# Patient Record
Sex: Male | Born: 1975 | Race: Black or African American | Hispanic: No | Marital: Single | Smoking: Never smoker
Health system: Southern US, Community
[De-identification: ages and names within clinical notes are randomized; demographics above are authoritative.]

---

## 2016-11-08 ENCOUNTER — Emergency Department (HOSPITAL_BASED_OUTPATIENT_CLINIC_OR_DEPARTMENT_OTHER)
Admission: EM | Admit: 2016-11-08 | Discharge: 2016-11-08 | Disposition: A | Discharge status: Home / Self Care | Payer: BLUE CROSS/BLUE SHIELD | Attending: Emergency Medicine | Admitting: Emergency Medicine

## 2016-11-08 ENCOUNTER — Encounter (HOSPITAL_BASED_OUTPATIENT_CLINIC_OR_DEPARTMENT_OTHER): Payer: Self-pay

## 2016-11-08 ENCOUNTER — Emergency Department (HOSPITAL_BASED_OUTPATIENT_CLINIC_OR_DEPARTMENT_OTHER): Payer: BLUE CROSS/BLUE SHIELD

## 2016-11-08 DIAGNOSIS — R112 Nausea with vomiting, unspecified: Secondary | ICD-10-CM | POA: Diagnosis not present

## 2016-11-08 DIAGNOSIS — R1084 Generalized abdominal pain: Secondary | ICD-10-CM | POA: Diagnosis not present

## 2016-11-08 DIAGNOSIS — R1013 Epigastric pain: Secondary | ICD-10-CM | POA: Insufficient documentation

## 2016-11-08 LAB — CBC
HCT: 39.5 % (ref 39.0–52.0)
HEMOGLOBIN: 13.9 g/dL (ref 13.0–17.0)
MCH: 28.6 pg (ref 26.0–34.0)
MCHC: 35.2 g/dL (ref 30.0–36.0)
MCV: 81.3 fL (ref 78.0–100.0)
PLATELETS: 133 10*3/uL — AB (ref 150–400)
RBC: 4.86 MIL/uL (ref 4.22–5.81)
RDW: 12.7 % (ref 11.5–15.5)
WBC: 8.1 10*3/uL (ref 4.0–10.5)

## 2016-11-08 LAB — URINALYSIS, ROUTINE W REFLEX MICROSCOPIC
Bilirubin Urine: NEGATIVE
Glucose, UA: NEGATIVE mg/dL
Hgb urine dipstick: NEGATIVE
Ketones, ur: 15 mg/dL — AB
LEUKOCYTES UA: NEGATIVE
Nitrite: NEGATIVE
PROTEIN: 100 mg/dL — AB
Specific Gravity, Urine: 1.024 (ref 1.005–1.030)
pH: 8.5 — ABNORMAL HIGH (ref 5.0–8.0)

## 2016-11-08 LAB — URINALYSIS, MICROSCOPIC (REFLEX)

## 2016-11-08 LAB — COMPREHENSIVE METABOLIC PANEL
ALT: 42 U/L (ref 17–63)
ANION GAP: 12 (ref 5–15)
AST: 43 U/L — ABNORMAL HIGH (ref 15–41)
Albumin: 4.5 g/dL (ref 3.5–5.0)
Alkaline Phosphatase: 52 U/L (ref 38–126)
BUN: 15 mg/dL (ref 6–20)
CALCIUM: 9.1 mg/dL (ref 8.9–10.3)
CHLORIDE: 103 mmol/L (ref 101–111)
CO2: 22 mmol/L (ref 22–32)
CREATININE: 0.9 mg/dL (ref 0.61–1.24)
Glucose, Bld: 116 mg/dL — ABNORMAL HIGH (ref 65–99)
Potassium: 3.3 mmol/L — ABNORMAL LOW (ref 3.5–5.1)
Sodium: 137 mmol/L (ref 135–145)
Total Bilirubin: 1.1 mg/dL (ref 0.3–1.2)
Total Protein: 7.7 g/dL (ref 6.5–8.1)

## 2016-11-08 LAB — LIPASE, BLOOD: LIPASE: 15 U/L (ref 11–51)

## 2016-11-08 MED ORDER — ONDANSETRON HCL 4 MG/2ML IJ SOLN
INTRAMUSCULAR | Status: AC
  Administered 2016-11-08: 4 mg via INTRAVENOUS
  Filled 2016-11-08: qty 2

## 2016-11-08 MED ORDER — SODIUM CHLORIDE 0.9 % IV BOLUS (SEPSIS)
1000.0000 mL | Freq: Once | INTRAVENOUS | Status: AC
  Administered 2016-11-08: 1000 mL via INTRAVENOUS

## 2016-11-08 MED ORDER — MORPHINE SULFATE (PF) 4 MG/ML IV SOLN
4.0000 mg | Freq: Once | INTRAVENOUS | Status: AC
  Administered 2016-11-08: 4 mg via INTRAVENOUS
  Filled 2016-11-08: qty 1

## 2016-11-08 MED ORDER — PROMETHAZINE HCL 25 MG/ML IJ SOLN
25.0000 mg | Freq: Once | INTRAMUSCULAR | Status: AC
  Administered 2016-11-08: 25 mg via INTRAVENOUS
  Filled 2016-11-08: qty 1

## 2016-11-08 MED ORDER — ONDANSETRON HCL 4 MG/2ML IJ SOLN
4.0000 mg | Freq: Once | INTRAMUSCULAR | Status: AC | PRN
  Administered 2016-11-08: 4 mg via INTRAVENOUS

## 2016-11-08 MED ORDER — ONDANSETRON 4 MG PO TBDP: ORAL_TABLET | ORAL | 0 refills | Status: AC

## 2016-11-08 NOTE — ED Notes (Signed)
Pt c/o nausea returned 

## 2016-11-08 NOTE — ED Triage Notes (Addendum)
Pt with n/v, abd pain started 11am-presents to triage in w/c-pale/diaphoretic-dry heaving

## 2016-11-08 NOTE — ED Notes (Signed)
Pt c/o severe abd pain; EDP notified, orders received.

## 2016-11-08 NOTE — ED Notes (Signed)
Pt resting more comfortably now

## 2016-11-08 NOTE — ED Provider Notes (Signed)
[facility]   CSN: [id number] Arrival date & time: 11/08/16  1506   By signing my name below, I, Jason Mccann, attest that this documentation has been prepared under the direction and in the presence of Jason Mornavid [name], NP Electronically Signed: Soijett Mccann, ED Scribe. 11/08/16. 4:41 PM.  History   Chief Complaint Chief Complaint  Patient presents with  . Emesis    HPI Jason GivensRashad Mccann is a 41 y.o. male who presents to the Emergency Department complaining of gradual onset emesis onset 11 AM this morning. He reports associated vomiting, epigastric abdominal pain, and chills. He hasn't tried any medications for the relief of his symptoms. Pt notes that he ate breakfast at 9 AM prior to the onset of his symptoms. Pt states that he was evaluated by the Nurse at his job and was informed to follow up in the ED following an episode of emesis during the visit with the nurse. Denies diarrhea, fever, and any other symptoms. Denies issues with gallbladder in the past.   The history is provided by the patient and a significant other. No language interpreter was used.  Emesis   This is a new problem. The current episode started 6 to 12 hours ago. The problem has not changed since onset.Associated symptoms include abdominal pain and chills. Pertinent negatives include no diarrhea and no fever.    History reviewed. No pertinent past medical history.  There are no active problems to display for this patient.   History reviewed. No pertinent surgical history.     Home Medications    Prior to Admission medications   Not on File    Family History No family history on file.  Social History Social History  Substance Use Topics  . Smoking status: Never Smoker  . Smokeless tobacco: Never Used  . Alcohol use No     Allergies   Patient has no known allergies.   Review of Systems Review of Systems  Constitutional: Positive for chills. Negative for fever.    Gastrointestinal: Positive for abdominal pain, nausea and vomiting. Negative for diarrhea.  All other systems reviewed and are negative.    Physical Exam Updated Vital Signs BP 143/89 (BP Location: Right Arm)   Pulse (!) 56   Temp 97.6 F (36.4 C) (Oral)   Resp 22   Ht 6\' 1"  (1.854 m)   Wt 260 lb (117.9 kg)   SpO2 100%   BMI 34.30 kg/m   Physical Exam  Constitutional: He is oriented to person, place, and time. He appears well-developed and well-nourished. No distress.  HENT:  Head: Normocephalic and atraumatic.  Eyes: EOM are normal.  Neck: Neck supple.  Cardiovascular: Normal rate, regular rhythm and normal heart sounds.  Exam reveals no gallop and no friction rub.   No murmur heard. Pulmonary/Chest: Effort normal and breath sounds normal. No respiratory distress. He has no wheezes. He has no rales.  Abdominal: Soft. He exhibits no distension. There is generalized tenderness and tenderness in the epigastric area.  Generalized abdominal tenderness, more focal in the epigastric region.   Musculoskeletal: Normal range of motion.  Neurological: He is alert and oriented to person, place, and time.  Skin: Skin is warm and dry.  Psychiatric: He has a normal mood and affect. His behavior is normal.  Nursing note and vitals reviewed.    ED Treatments / Results  DIAGNOSTIC STUDIES: Oxygen Saturation is 100% on RA, nl by my interpretation.    COORDINATION OF CARE: 4:39 PM  Discussed treatment plan with pt at bedside which includes labs, UA, morphine, phenergan, zofran, and pt agreed to plan.    Labs (all labs ordered are listed, but only abnormal results are displayed) Labs Reviewed  COMPREHENSIVE METABOLIC PANEL - Abnormal; Notable for the following:       Result Value   Potassium 3.3 (*)    Glucose, Bld 116 (*)    AST 43 (*)    All other components within normal limits  CBC - Abnormal; Notable for the following:    Platelets 133 (*)    All other components within  normal limits  URINALYSIS, ROUTINE W REFLEX MICROSCOPIC - Abnormal; Notable for the following:    APPearance TURBID (*)    pH 8.5 (*)    Ketones, ur 15 (*)    Protein, ur 100 (*)    All other components within normal limits  URINALYSIS, MICROSCOPIC (REFLEX) - Abnormal; Notable for the following:    Bacteria, UA RARE (*)    Squamous Epithelial / LPF 0-5 (*)    All other components within normal limits  LIPASE, BLOOD    Radiology Dg Abdomen Acute W/chest  Result Date: 11/08/2016 CLINICAL DATA:  Nausea, vomiting and abdominal pain since 1100 hours, pale, diaphoretic, dry heaving EXAM: DG ABDOMEN ACUTE W/ 1V CHEST COMPARISON:  None FINDINGS: Upper normal heart size. Mediastinal contours and pulmonary vascularity normal. Lungs clear. No pleural effusion or pneumothorax. Scattered stool throughout proximal half of colon. Normal bowel gas pattern without bowel dilatation, bowel wall thickening or free air. BILATERAL pelvic phleboliths. No definite urinary tract calcification or acute osseous findings. IMPRESSION: No acute abnormalities. Electronically Signed   By: Ulyses Southward M.D.   On: 11/08/2016 18:06    Procedures Procedures (including critical care time)  Medications Ordered in ED Medications  ondansetron (ZOFRAN) injection 4 mg (4 mg Intravenous Given 11/08/16 1546)  sodium chloride 0.9 % bolus 1,000 mL (0 mLs Intravenous Stopped 11/08/16 1621)  promethazine (PHENERGAN) injection 25 mg (25 mg Intravenous Given 11/08/16 1656)  morphine 4 MG/ML injection 4 mg (4 mg Intravenous Given 11/08/16 1614)     Initial Impression / Assessment and Plan / ED Course  I have reviewed the triage vital signs and the nursing notes.  Pertinent labs & imaging results that were available during my care of the patient were reviewed by me and considered in my medical decision making (see chart for details).   Patient is nontoxic, nonseptic appearing  Patient's pain and other symptoms adequately managed in  emergency department.  Fluid bolus given.  Labs, imaging and vitals reviewed.  Patient does not meet the SIRS or Sepsis criteria.  On repeat exam patient does not have a surgical abdomin and there are no peritoneal signs.  No indication of appendicitis, bowel obstruction, bowel perforation, cholecystitis, diverticulitis. Patient discharged home with symptomatic treatment and given strict instructions for follow-up with their primary care physician.  I have also discussed reasons to return immediately to the ER.  Patient expresses understanding and agrees with plan.      Final Clinical Impressions(s) / ED Diagnoses   Final diagnoses:  Nausea and vomiting in adult  Generalized abdominal pain    New Prescriptions New Prescriptions   ONDANSETRON (ZOFRAN ODT) 4 MG DISINTEGRATING TABLET    4mg  ODT q4 hours prn nausea/vomit   I personally performed the services described in this documentation, which was scribed in my presence. The recorded information has been reviewed and is accurate.  Jason Morn, NP 11/09/16 0003    Charlynne Pander, MD 11/11/16 [phone]

## 2018-09-07 IMAGING — DX DG ABDOMEN ACUTE W/ 1V CHEST
5 series · 5 of 5 positions shown · non-contrast
Comparison: None

CLINICAL DATA: Nausea, vomiting and abdominal pain since 7711
hours, pale, diaphoretic, dry heaving

EXAM:
DG ABDOMEN ACUTE W/ 1V CHEST

[chest pa]
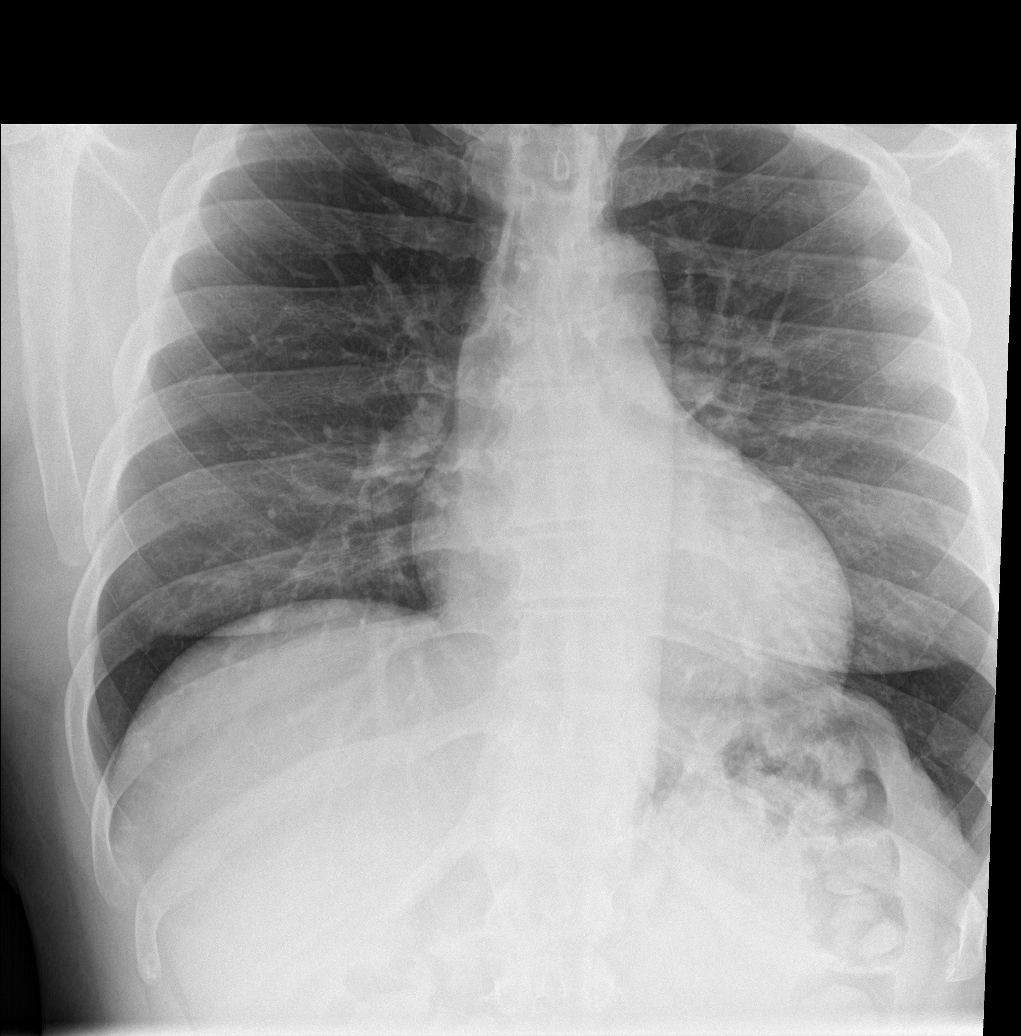

[abdomen erect]
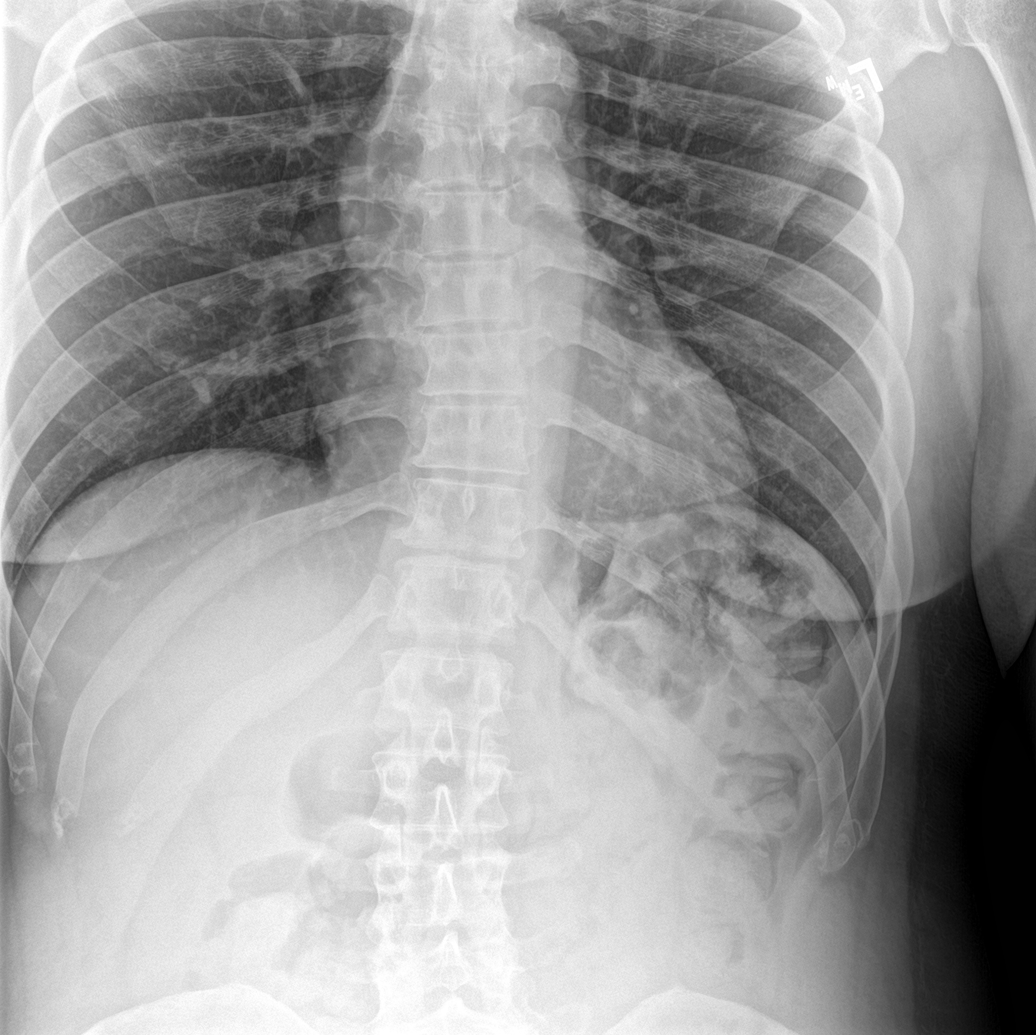

[abdomen supine (1 of 3)]
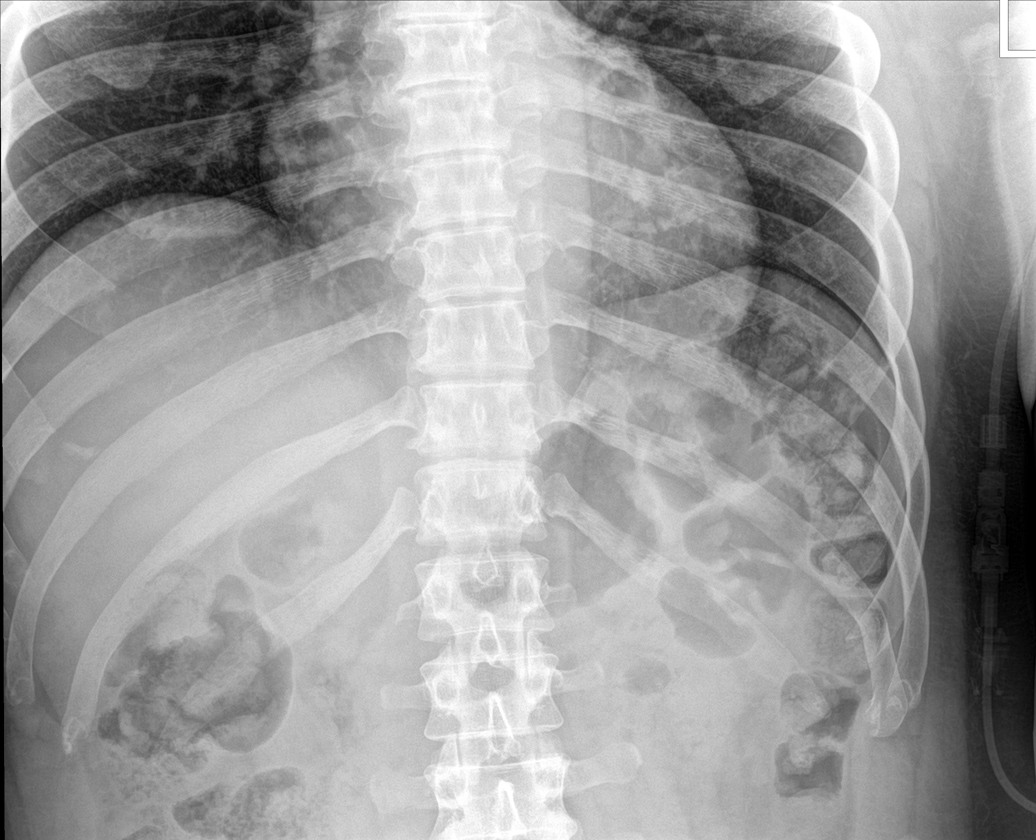

[abdomen supine (2 of 3)]
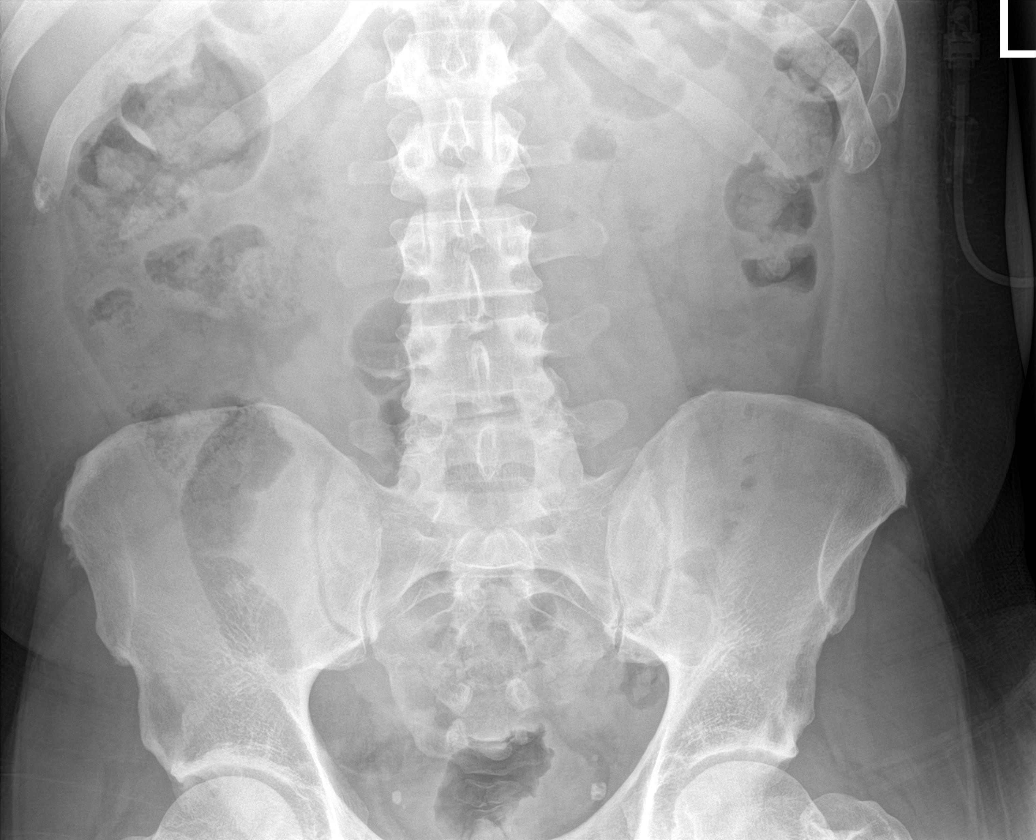

[abdomen supine (3 of 3)]
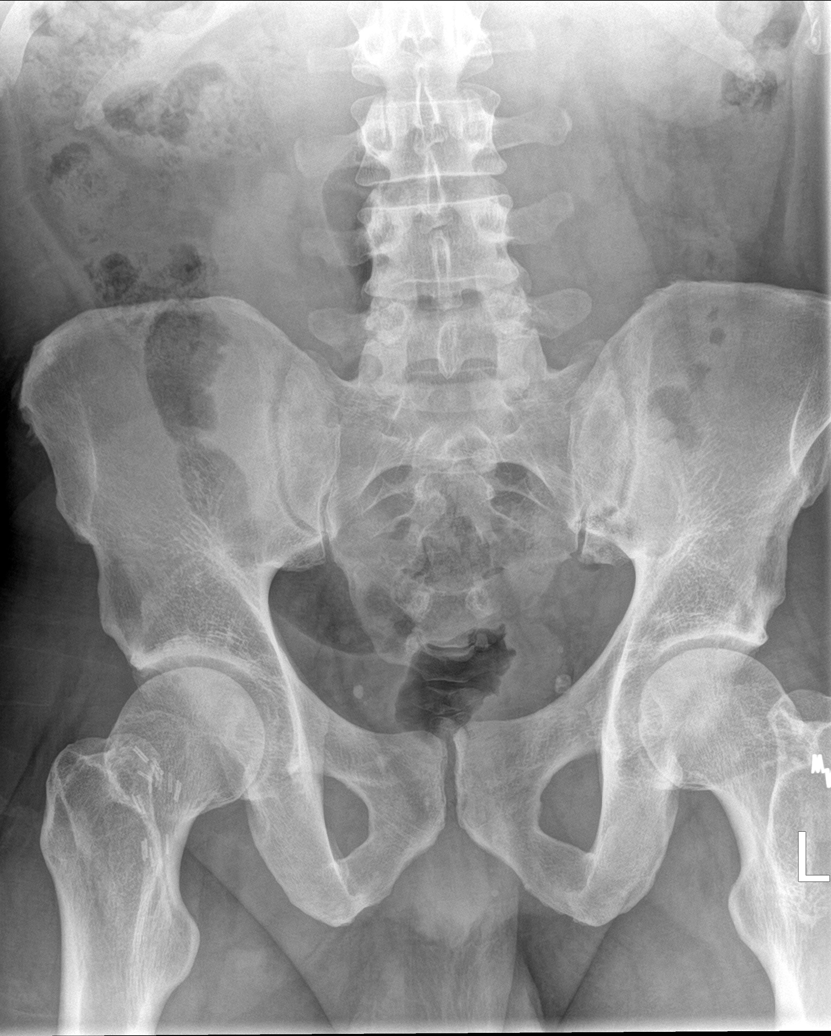

[5 of 5 positions shown; findings below may reference images not displayed]

FINDINGS: Upper normal heart size.

Mediastinal contours and pulmonary vascularity normal.

Lungs clear.

No pleural effusion or pneumothorax.

Scattered stool throughout proximal half of colon.

Normal bowel gas pattern without bowel dilatation, bowel wall
thickening or free air.

BILATERAL pelvic phleboliths.

No definite urinary tract calcification or acute osseous findings.
IMPRESSION: No acute abnormalities.
